# Patient Record
Sex: Male | Born: 2005 | Race: Black or African American | Hispanic: No | Marital: Single | Smoking: Never smoker
Health system: Southern US, Community
[De-identification: ages and names within clinical notes are randomized; demographics above are authoritative.]

## PROBLEM LIST (undated history)

## (undated) HISTORY — PX: HYDROCELE EXCISION: SHX482

## (undated) HISTORY — PX: TONSILLECTOMY: SUR1361

---
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
## 2023-09-19 ENCOUNTER — Ambulatory Visit (INDEPENDENT_AMBULATORY_CARE_PROVIDER_SITE_OTHER): Payer: Medicaid Other | Admitting: Family

## 2023-09-19 ENCOUNTER — Encounter (INDEPENDENT_AMBULATORY_CARE_PROVIDER_SITE_OTHER): Payer: Self-pay | Admitting: Family

## 2023-09-19 VITALS — BP 124/80 | HR 72 | Ht 72.05 in | Wt 250.6 lb

## 2023-09-19 DIAGNOSIS — R7303 Prediabetes: Secondary | ICD-10-CM | POA: Diagnosis not present

## 2023-09-19 DIAGNOSIS — Z68.41 Body mass index (BMI) pediatric, greater than or equal to 95th percentile for age: Secondary | ICD-10-CM | POA: Diagnosis not present

## 2023-09-19 DIAGNOSIS — E6609 Other obesity due to excess calories: Secondary | ICD-10-CM | POA: Diagnosis not present

## 2023-09-19 DIAGNOSIS — L83 Acanthosis nigricans: Secondary | ICD-10-CM

## 2023-09-19 LAB — POCT GLYCOSYLATED HEMOGLOBIN (HGB A1C): HbA1c, POC (prediabetic range): 6.4 % (ref 5.7–6.4)

## 2023-09-19 LAB — POCT GLUCOSE (DEVICE FOR HOME USE): Glucose Fasting, POC: 124 mg/dL — AB (ref 70–99)

## 2023-09-19 NOTE — Patient Instructions (Signed)
 It was a pleasure seeing you in clinic today. Please do not hesitate to contact me if you have questions or concerns.    Please sign up for MyChart. This is a communication tool that allows you to send an email directly to me. This can be used for questions, prescriptions and blood sugar reports. We will also release labs to you with instructions on MyChart. Please do not use MyChart if you need immediate or emergency assistance. Ask our wonderful front office staff if you need assistance.   -Eliminate sugary drinks (regular soda, juice, sweet tea, regular gatorade) from your diet -Drink water or milk (preferably 1% or skim) -Avoid fried foods and junk food (chips, cookies, candy) -Watch portion sizes -Pack your lunch for school -Try to get 30 minutes of activity daily  - Follow up with family practice.

## 2023-09-19 NOTE — Progress Notes (Signed)
 Pediatric Endocrinology Consultation follow up Visit  Wong, Scott 2005/11/17  [facility]  Chief Complaint: prediabetes   History obtained from: patient, parent, and review of records from PCP  HPI: Scott Wong  is a 18 y.o. male being seen in consultation at the request of  [facility] for evaluation of the above concerns.  he is accompanied to this visit by his mother and brother.   1.  Scott Wong was seen by his PCP on 04/2020 for a [facility] where he was noted to have elevated hemoglobin A1c of 6% and his younger brother has type 2 diabetes currently on Metformin .   [facility] is [facility] ([facility]) for further evaluation.   2. Scott Wong's last visit to clinic was on 09/2021, since then, he has been well.    He has graduated from [employer], he went to [employer] for a little while but then stopped. He is currently looking for a job.   Mom states that she noticed he is eating a lot more and feels like his neck has gotten darker.    Diet:  - he drinks sugar drinks on and off. He has not been able to buy sugar drinks since he stopped working.  - he was going out to eat 2-3 x per week but has had to cut back recently.  - He usually eats 2 plates of food at meals. He has trouble portion control  - he snacks throughout the day. Reports he rarely eats healthy.   Drinks about 1 sugar drink per week. Usually water.  - He eats out or gets fast food on special occasions.  - Reports eating 1 serving of food, usually a moderate size plate.  - Snacks: cookies, fruit   Exercise  -  No exercise.    ROS: All systems reviewed with pertinent positives listed below; otherwise negative. Constitutional: 8 lbs weight gain.   Sleeping well HEENT: No vision changes. No neck pain or difficulty swallowing.  Respiratory: No increased work of breathing currently GI: No constipation or diarrhea GU: No polyuria.   Musculoskeletal: No joint deformity Neuro: Normal affect. No tremors.  Endocrine: As above   Past Medical History:  No past medical history on file.  Birth History: Pregnancy uncomplicated. Delivered at term Discharged home with mom  Meds: Outpatient Encounter Medications as of 09/19/2023  Medication Sig   Clindamycin-Benzoyl Per, Refr, gel Apply 1 application topically every morning.   doxycycline (VIBRAMYCIN) 100 MG capsule Take 100 mg by mouth 2 (two) times daily.   Vitamin D, Ergocalciferol, (DRISDOL) 1.25 MG (50000 UNIT) CAPS capsule Take 50,000 Units by mouth once a week.   No facility-administered encounter medications on file as of 09/19/2023.    Allergies: Allergies  Allergen Reactions   Augmentin [Amoxicillin-Pot Clavulanate]     Surgical History: Past Surgical History:  Procedure Laterality Date   HYDROCELE EXCISION     TONSILLECTOMY      Family History:  Family History  Problem Relation Age of Onset   Gestational diabetes Mother    Diabetes Mother    Anxiety disorder Mother    Diabetes Father    Hypertension Father    Diabetes Brother    Arthritis Maternal Grandmother    Fibromyalgia Maternal Grandmother    Diabetes Maternal Grandfather    Heart disease Maternal Grandfather    Kidney disease Paternal Grandfather    ADD / ADHD Brother     Social History: Lives with:  Mother, siblings  Currently in 12th  grade Social History   Social History Narrative   He lives with mom, brothers and sister   He is in 12th grade, [employer]     Physical Exam:  There were no vitals filed for this visit.     Body mass index: body mass index is unknown because there is no height or weight on file. Blood pressure %iles are not available for patients who are 18 years or older.  Wt Readings from Last 3 Encounters:  05/10/22 217 lb 1.6 oz (98.5 kg) (97%, Z= 1.91)*  09/23/21 (!) 215 lb 12.8 oz (97.9 kg) (97%, Z= 1.95)*  05/24/21 (!) 207 lb  (93.9 kg) (97%, Z= 1.83)*   * Growth percentiles are based on CDC (Boys, 2-20 Years) data.   Ht Readings from Last 3 Encounters:  05/10/22 6' (1.829 m) (82%, Z= 0.92)*  09/23/21 5' 11.46 (1.815 m) (78%, Z= 0.78)*  05/24/21 5' 11.42 (1.814 m) (79%, Z= 0.80)*   * Growth percentiles are based on CDC (Boys, 2-20 Years) data.     No weight on file for this encounter. No height on file for this encounter. No height and weight on file for this encounter.  General: Obese  male in no acute distress.   Head: Normocephalic, atraumatic.   Eyes:  Pupils equal and round. EOMI.  Sclera white.  No eye drainage.   Ears/Nose/Mouth/Throat: Nares patent, no nasal drainage.  Normal dentition, mucous membranes moist.  Neck: supple, no cervical lymphadenopathy, no thyromegaly Cardiovascular: regular rate, normal S1/S2, no murmurs Respiratory: No increased work of breathing.  Lungs clear to auscultation bilaterally.  No wheezes. Abdomen: soft, nontender, nondistended. Normal bowel sounds.  No appreciable masses  Extremities: warm, well perfused, cap refill < 2 sec.   Musculoskeletal: Normal muscle mass.  Normal strength Skin: warm, dry.  No rash or lesions. + acanthosis nigricans  Neurologic: alert and oriented, normal speech, no tremor]  Laboratory Evaluation: Results for orders placed or performed during the hospital encounter of 05/10/22  CBC with Differential   Collection Time: 05/10/22  9:27 AM  Result Value Ref Range   WBC 4.9 4.0 - 10.5 K/uL   RBC 5.33 4.22 - 5.81 MIL/uL   Hemoglobin 14.2 13.0 - 17.0 g/dL   HCT 55.8 60.9 - 47.9 %   MCV 82.7 80.0 - 100.0 fL   MCH 26.6 26.0 - 34.0 pg   MCHC 32.2 30.0 - 36.0 g/dL   RDW 87.3 88.4 - 84.4 %   Platelets 262 150 - 400 K/uL   nRBC 0.0 0.0 - 0.2 %   Neutrophils Relative % 47 %   Neutro Abs 2.3 1.7 - 7.7 K/uL   Lymphocytes Relative 40 %   Lymphs Abs 1.9 0.7 - 4.0 K/uL   Monocytes Relative 9 %   Monocytes Absolute 0.4 0.1 - 1.0 K/uL    Eosinophils Relative 3 %   Eosinophils Absolute 0.1 0.0 - 0.5 K/uL   Basophils Relative 1 %   Basophils Absolute 0.0 0.0 - 0.1 K/uL   Immature Granulocytes 0 %   Abs Immature Granulocytes 0.01 0.00 - 0.07 K/uL  Comprehensive metabolic panel   Collection Time: 05/10/22  9:27 AM  Result Value Ref Range   Sodium 140 135 - 145 mmol/L   Potassium 3.6 3.5 - 5.1 mmol/L   Chloride 106 98 - 111 mmol/L   CO2 28 22 - 32 mmol/L   Glucose, Bld 100 (H) 70 -  99 mg/dL   BUN 16 6 - 20 mg/dL   Creatinine, Ser 8.94 0.61 - 1.24 mg/dL   Calcium 9.5 8.9 - 89.6 mg/dL   Total Protein 7.8 6.5 - 8.1 g/dL   Albumin 4.6 3.5 - 5.0 g/dL   AST 19 15 - 41 U/L   ALT 26 0 - 44 U/L   Alkaline Phosphatase 71 38 - 126 U/L   Total Bilirubin 0.4 0.3 - 1.2 mg/dL   GFR, Estimated >39 >39 mL/min   Anion gap 6 5 - 15  Troponin I (High Sensitivity)   Collection Time: 05/10/22  9:27 AM  Result Value Ref Range   Troponin I (High Sensitivity) 2 <18 ng/L    Assessment/Plan: Julis Lorenzo is a 18 y.o. male with prediabetes and strong family history of T2DM. Hemoglobin A1c has increased to 6.4% since he was last seen 2 years ago. He has struggled to make lifestyle changes.   1. Prediabetes 2. Acanthosis nigricans  3. Obesity due to excess calories with serious comorbidity and body mass index (BMI) in 95th percentile to less than 120% of 95th percentile for age in pediatric patient  -Eliminate sugary drinks (regular soda, juice, sweet tea, regular gatorade) from your diet -Drink water or milk (preferably 1% or skim) -Avoid fried foods and junk food (chips, cookies, candy) -Watch portion sizes -Pack your lunch for school -Try to get 30 minutes of activity daily - Discussed importance of healthy diet and daily activity to reduce insulin resistance.  - Plan to start 500 mg of Metformin  ER pending labs. Discussed possible benefits and side effects.  - He will need to follow up with family medicine for ongoing management  due to his age, he has an appointment scheduled for the end of the month.  Lab Orders         COMPLETE METABOLIC PANEL WITH GFR         Lipid panel         Microalbumin / creatinine urine ratio         T4, free         TSH         POCT Glucose (Device for Home Use)         POCT glycosylated hemoglobin (Hb A1C)     Follow-up:   No follow-ups on file.   Medical decision-making:  LOS: 34 spent today reviewing the medical chart, counseling the patient/family, and documenting today's visit.    Jeannene Penton, DNP, FNP-C  [facility]  [address]  [place], [address]  Tele: [phone]

## 2023-09-20 LAB — COMPLETE METABOLIC PANEL WITH GFR
AG Ratio: 2.1 (calc) (ref 1.0–2.5)
ALT: 26 U/L (ref 8–46)
AST: 15 U/L (ref 12–32)
Albumin: 4.9 g/dL (ref 3.6–5.1)
Alkaline phosphatase (APISO): 87 U/L (ref 46–169)
BUN: 14 mg/dL (ref 7–20)
CO2: 30 mmol/L (ref 20–32)
Calcium: 10.1 mg/dL (ref 8.9–10.4)
Chloride: 102 mmol/L (ref 98–110)
Creat: 1.08 mg/dL (ref 0.60–1.24)
Globulin: 2.3 g/dL (ref 2.1–3.5)
Glucose, Bld: 116 mg/dL (ref 65–139)
Potassium: 4.5 mmol/L (ref 3.8–5.1)
Sodium: 141 mmol/L (ref 135–146)
Total Bilirubin: 0.3 mg/dL (ref 0.2–1.1)
Total Protein: 7.2 g/dL (ref 6.3–8.2)
eGFR: 101 mL/min/{1.73_m2} (ref >=60)

## 2023-09-20 LAB — LIPID PANEL
Cholesterol: 195 mg/dL — ABNORMAL HIGH (ref <170)
HDL: 42 mg/dL — ABNORMAL LOW (ref >45)
LDL Cholesterol (Calc): 125 mg/dL — ABNORMAL HIGH (ref <110)
Non-HDL Cholesterol (Calc): 153 mg/dL — ABNORMAL HIGH (ref <120)
Total CHOL/HDL Ratio: 4.6 (calc) (ref <5.0)
Triglycerides: 165 mg/dL — ABNORMAL HIGH (ref <90)

## 2023-09-20 LAB — T4, FREE: Free T4: 1.1 ng/dL (ref 0.8–1.4)

## 2023-09-20 LAB — MICROALBUMIN / CREATININE URINE RATIO
Creatinine, Urine: 318 mg/dL (ref 20–320)
Microalb Creat Ratio: 1 mg/g{creat} (ref <30)
Microalb, Ur: 0.3 mg/dL

## 2023-09-20 LAB — TSH: TSH: 2.04 m[IU]/L (ref 0.50–4.30)

## 2023-09-21 ENCOUNTER — Telehealth (INDEPENDENT_AMBULATORY_CARE_PROVIDER_SITE_OTHER): Payer: Self-pay

## 2023-09-21 ENCOUNTER — Other Ambulatory Visit (INDEPENDENT_AMBULATORY_CARE_PROVIDER_SITE_OTHER): Payer: Self-pay | Admitting: Family

## 2023-09-21 MED ORDER — METFORMIN HCL ER 500 MG PO TB24: 500.0000 mg | ORAL_TABLET | Freq: Every day | ORAL | 3 refills | Status: AC

## 2023-09-21 NOTE — Telephone Encounter (Signed)
Called mom Per Spenser "Please call family. Labs show normal liver function, thyroid levels and microalbumin. LIpid panel shows elevated cholesterol, LDL (bad cholesterol and triglycerides. Please work on improving diet and daily activity. I am ordering 500 mg of Metformin ER as we discussed at visit." Mom had no further questions and said she's going to pick up the medication.
